# Patient Record
Sex: Male | Born: 2005
Health system: Southern US, Community
[De-identification: ages and names within clinical notes are randomized; demographics above are authoritative.]

## PROBLEM LIST (undated history)

## (undated) ENCOUNTER — Emergency Department (HOSPITAL_COMMUNITY): Admission: EM | Payer: Self-pay | Source: Home / Self Care

## (undated) DIAGNOSIS — L509 Urticaria, unspecified: Secondary | ICD-10-CM

## (undated) DIAGNOSIS — L309 Dermatitis, unspecified: Secondary | ICD-10-CM

## (undated) HISTORY — PX: TYMPANOSTOMY TUBE PLACEMENT: SHX32

## (undated) HISTORY — DX: Urticaria, unspecified: L50.9

## (undated) HISTORY — DX: Dermatitis, unspecified: L30.9

---
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
## 2024-02-24 ENCOUNTER — Ambulatory Visit: Payer: 59 | Admitting: Allergy

## 2024-02-24 DIAGNOSIS — J309 Allergic rhinitis, unspecified: Secondary | ICD-10-CM

## 2024-06-21 DIAGNOSIS — Z113 Encounter for screening for infections with a predominantly sexual mode of transmission: Secondary | ICD-10-CM | POA: Diagnosis not present

## 2024-06-21 DIAGNOSIS — F1220 Cannabis dependence, uncomplicated: Secondary | ICD-10-CM | POA: Diagnosis not present

## 2024-09-14 ENCOUNTER — Emergency Department (HOSPITAL_BASED_OUTPATIENT_CLINIC_OR_DEPARTMENT_OTHER)
Admission: EM | Admit: 2024-09-14 | Discharge: 2024-09-15 | Disposition: A | Discharge status: Home / Self Care | Attending: Emergency Medicine | Admitting: Emergency Medicine

## 2024-09-14 ENCOUNTER — Other Ambulatory Visit: Payer: Self-pay

## 2024-09-14 ENCOUNTER — Emergency Department (HOSPITAL_BASED_OUTPATIENT_CLINIC_OR_DEPARTMENT_OTHER): Admitting: Radiology

## 2024-09-14 DIAGNOSIS — L509 Urticaria, unspecified: Secondary | ICD-10-CM | POA: Insufficient documentation

## 2024-09-14 DIAGNOSIS — R9431 Abnormal electrocardiogram [ECG] [EKG]: Secondary | ICD-10-CM | POA: Diagnosis not present

## 2024-09-14 DIAGNOSIS — R0789 Other chest pain: Secondary | ICD-10-CM | POA: Diagnosis not present

## 2024-09-14 DIAGNOSIS — Z91010 Allergy to peanuts: Secondary | ICD-10-CM | POA: Insufficient documentation

## 2024-09-14 DIAGNOSIS — R079 Chest pain, unspecified: Secondary | ICD-10-CM | POA: Diagnosis not present

## 2024-09-14 DIAGNOSIS — T7840XA Allergy, unspecified, initial encounter: Secondary | ICD-10-CM | POA: Diagnosis not present

## 2024-09-14 DIAGNOSIS — R21 Rash and other nonspecific skin eruption: Secondary | ICD-10-CM | POA: Diagnosis present

## 2024-09-14 LAB — CBC
HCT: 49.5 % (ref 39.0–52.0)
Hemoglobin: 16.7 g/dL (ref 13.0–17.0)
MCH: 32.1 pg (ref 26.0–34.0)
MCHC: 33.7 g/dL (ref 30.0–36.0)
MCV: 95 fL (ref 80.0–100.0)
Platelets: 388 K/uL (ref 150–400)
RBC: 5.21 MIL/uL (ref 4.22–5.81)
RDW: 11.1 % — ABNORMAL LOW (ref 11.5–15.5)
WBC: 8 K/uL (ref 4.0–10.5)
nRBC: 0 % (ref 0.0–0.2)

## 2024-09-14 MED ORDER — ONDANSETRON HCL 4 MG/2ML IJ SOLN
4.0000 mg | Freq: Once | INTRAMUSCULAR | Status: AC
  Administered 2024-09-14: 4 mg via INTRAVENOUS
  Filled 2024-09-14: qty 2

## 2024-09-14 NOTE — ED Triage Notes (Signed)
 Pt POV reporting increased hives x2 days, has been dealing with intermittent allergic reactions past few months, unaware of cause, worsening hives 2 days ago, steroid shot given today at PCP, now having chest pain and SOB, denies throat swelling at this time.

## 2024-09-15 LAB — BASIC METABOLIC PANEL WITH GFR
Anion gap: 16 — ABNORMAL HIGH (ref 5–15)
BUN: 9 mg/dL (ref 6–20)
CO2: 24 mmol/L (ref 22–32)
Calcium: 10.1 mg/dL (ref 8.9–10.3)
Chloride: 99 mmol/L (ref 98–111)
Creatinine, Ser: 1.21 mg/dL (ref 0.61–1.24)
GFR, Estimated: >60 mL/min (ref >60)
Glucose, Bld: 103 mg/dL — ABNORMAL HIGH (ref 70–99)
Potassium: 3.7 mmol/L (ref 3.5–5.1)
Sodium: 139 mmol/L (ref 135–145)

## 2024-09-15 LAB — TROPONIN T, HIGH SENSITIVITY: Troponin T High Sensitivity: <15 ng/L (ref 0–19)

## 2024-09-15 MED ORDER — METHYLPREDNISOLONE SODIUM SUCC 125 MG IJ SOLR
125.0000 mg | Freq: Once | INTRAMUSCULAR | Status: AC
Start: 2024-09-15 — End: 2024-09-15
  Administered 2024-09-15: 125 mg via INTRAVENOUS
  Filled 2024-09-15: qty 2

## 2024-09-15 MED ORDER — FAMOTIDINE IN NACL 20-0.9 MG/50ML-% IV SOLN
20.0000 mg | Freq: Once | INTRAVENOUS | Status: AC
  Administered 2024-09-15: 20 mg via INTRAVENOUS
  Filled 2024-09-15: qty 50

## 2024-09-15 MED ORDER — DIPHENHYDRAMINE HCL 50 MG/ML IJ SOLN
25.0000 mg | Freq: Once | INTRAMUSCULAR | Status: AC
  Administered 2024-09-15: 25 mg via INTRAVENOUS
  Filled 2024-09-15: qty 1

## 2024-09-15 MED ORDER — PREDNISONE 10 MG PO TABS: 20.0000 mg | ORAL_TABLET | Freq: Two times a day (BID) | ORAL | 0 refills | Status: DC

## 2024-09-15 NOTE — ED Provider Notes (Signed)
 [place] EMERGENCY DEPARTMENT AT [facility] Provider Note   CSN: [id number] Arrival date & time: 09/14/24  2247     Patient presents with: Urticaria   Gerald Peterson is a 18 y.o. male.   Patient is a 18 year old male with history of unexplained urticaria.  He has seen an allergist for this condition, but no definitive cause has been found.  He was told he was allergic to peanuts, but this does not seem to be a trigger for him.  Patient presenting today with complaints of rash/hives and swelling to his face and extremities.  He went to his primary doctor who was given a steroid injection.  He went home, then developed some tightness in his chest and feeling short of breath.  This has since resolved.  He denies any fevers or chills.  No new contacts or exposures.       Prior to Admission medications   Medication Sig Start Date End Date Taking? Authorizing Provider  CIMETIDINE PO Take by mouth.    [provider]  famotidine  (PEPCID ) 20 MG tablet Take 1 tablet (20 mg total) by mouth 2 (two) times daily. 08/26/23   Jeneal Danita Macintosh, MD  loratadine  (CLARITIN ) 10 MG tablet Take 1 tablet (10 mg total) by mouth 2 (two) times daily. 08/26/23   Jeneal Danita Macintosh, MD  Multiple Vitamins-Minerals (MENS MULTIVITAMIN PO) Take by mouth. Patient not taking: Reported on 08/26/2023    [provider]  omalizumab  (XOLAIR ) 150 MG/ML prefilled syringe INJECT 2 SYRINGES UNDER THE SKIN EVERY 4 WEEKS 08/11/23   Jeneal Danita Macintosh, MD    Allergies: Peanut-containing drug products    Review of Systems  All other systems reviewed and are negative.   Updated Vital Signs BP 134/69   Pulse 95   Temp 98.9 F (37.2 C)   Resp 18   Ht 5' 10 (1.778 m)   Wt 73.9 kg   SpO2 97%   BMI 23.39 kg/m   Physical Exam Vitals and nursing note reviewed.  Constitutional:      General: He is not in acute distress.    Appearance: He is well-developed. He is not  diaphoretic.  HENT:     Head: Normocephalic and atraumatic.  Cardiovascular:     Rate and Rhythm: Normal rate and regular rhythm.     Heart sounds: No murmur heard.    No friction rub.  Pulmonary:     Effort: Pulmonary effort is normal. No respiratory distress.     Breath sounds: Normal breath sounds. No wheezing or rales.  Abdominal:     General: Bowel sounds are normal. There is no distension.     Palpations: Abdomen is soft.     Tenderness: There is no abdominal tenderness.  Musculoskeletal:        General: Normal range of motion.     Cervical back: Normal range of motion and neck supple.  Skin:    General: Skin is warm and dry.     Comments: There are small urticarial lesions noted to the forehead and both upper extremities.  Neurological:     Mental Status: He is alert and oriented to person, place, and time.     Coordination: Coordination normal.     (all labs ordered are listed, but only abnormal results are displayed) Labs Reviewed  BASIC METABOLIC PANEL WITH GFR - Abnormal; Notable for the following components:      Result Value   Glucose, Bld 103 (*)  Anion gap 16 (*)    All other components within normal limits  CBC - Abnormal; Notable for the following components:   RDW 11.1 (*)    All other components within normal limits  TROPONIN T, HIGH SENSITIVITY  TROPONIN T, HIGH SENSITIVITY    EKG: None  Radiology: DG Chest 2 View Result Date: 09/14/2024 CLINICAL DATA:  Chest pain. Increasing hives over 2 days. Intermittent allergic reactions over the past few months. Chest pain and shortness of breath. EXAM: CHEST - 2 VIEW COMPARISON:  08/01/2022 FINDINGS: The heart size and mediastinal contours are within normal limits. Both lungs are clear. The visualized skeletal structures are unremarkable. IMPRESSION: No active cardiopulmonary disease. Electronically Signed   By: Elsie Gravely M.D.   On: 09/14/2024 23:21     Procedures   Medications Ordered in the ED   methylPREDNISolone  sodium succinate (SOLU-MEDROL ) 125 mg/2 mL injection 125 mg (has no administration in time range)  diphenhydrAMINE  (BENADRYL ) injection 25 mg (has no administration in time range)  famotidine  (PEPCID ) IVPB 20 mg premix (has no administration in time range)  ondansetron  (ZOFRAN ) injection 4 mg (4 mg Intravenous Given 09/14/24 2342)                                    Medical Decision Making Amount and/or Complexity of Data Reviewed Labs: ordered. Radiology: ordered.  Risk Prescription drug management.   Patient presenting here with complaints of rash as described in the HPI.  He has been evaluated by an allergist, but no cause has been found.  Patient arrives here with an urticarial rash, but no evidence for anaphylaxis or systemic symptoms.  Patient given Solu-Medrol  along with Benadryl  and Pepcid  and rash is improving.  He will be discharged with prednisone  and continued use of his home medications.  To return as needed for any problems.     Final diagnoses:  None    ED Discharge Orders     None          Geroldine Berg, MD 09/15/24 [phone]

## 2024-09-15 NOTE — Discharge Instructions (Signed)
 Begin taking prednisone  as prescribed.  Take Benadryl  25 mg every 6 hours while awake for the next few days.  Return to the ER if you develop throat swelling, difficulty breathing, or for other new and concerning symptoms.

## 2024-09-17 ENCOUNTER — Emergency Department (HOSPITAL_BASED_OUTPATIENT_CLINIC_OR_DEPARTMENT_OTHER)
Admission: EM | Admit: 2024-09-17 | Discharge: 2024-09-17 | Disposition: A | Discharge status: Home / Self Care | Attending: Emergency Medicine | Admitting: Emergency Medicine

## 2024-09-17 ENCOUNTER — Other Ambulatory Visit: Payer: Self-pay

## 2024-09-17 ENCOUNTER — Encounter (HOSPITAL_BASED_OUTPATIENT_CLINIC_OR_DEPARTMENT_OTHER): Payer: Self-pay | Admitting: Emergency Medicine

## 2024-09-17 DIAGNOSIS — B349 Viral infection, unspecified: Secondary | ICD-10-CM | POA: Insufficient documentation

## 2024-09-17 DIAGNOSIS — L509 Urticaria, unspecified: Secondary | ICD-10-CM | POA: Insufficient documentation

## 2024-09-17 DIAGNOSIS — Z91010 Allergy to peanuts: Secondary | ICD-10-CM | POA: Insufficient documentation

## 2024-09-17 DIAGNOSIS — R509 Fever, unspecified: Secondary | ICD-10-CM | POA: Diagnosis present

## 2024-09-17 LAB — URINALYSIS, W/ REFLEX TO CULTURE (INFECTION SUSPECTED)
Bacteria, UA: NONE SEEN
Glucose, UA: NEGATIVE mg/dL
Hgb urine dipstick: NEGATIVE
Ketones, ur: 15 mg/dL — AB
Leukocytes,Ua: NEGATIVE
Nitrite: NEGATIVE
Protein, ur: 30 mg/dL — AB
Specific Gravity, Urine: 1.041 — ABNORMAL HIGH (ref 1.005–1.030)
pH: 6 (ref 5.0–8.0)

## 2024-09-17 LAB — CBC WITH DIFFERENTIAL/PLATELET
Abs Immature Granulocytes: 0.03 K/uL (ref 0.00–0.07)
Basophils Absolute: 0 K/uL (ref 0.0–0.1)
Basophils Relative: 0 %
Eosinophils Absolute: 0 K/uL (ref 0.0–0.5)
Eosinophils Relative: 0 %
HCT: 45.5 % (ref 39.0–52.0)
Hemoglobin: 15.8 g/dL (ref 13.0–17.0)
Immature Granulocytes: 0 %
Lymphocytes Relative: 10 %
Lymphs Abs: 1.2 K/uL (ref 0.7–4.0)
MCH: 32.4 pg (ref 26.0–34.0)
MCHC: 34.7 g/dL (ref 30.0–36.0)
MCV: 93.2 fL (ref 80.0–100.0)
Monocytes Absolute: 0.2 K/uL (ref 0.1–1.0)
Monocytes Relative: 2 %
Neutro Abs: 10.1 K/uL — ABNORMAL HIGH (ref 1.7–7.7)
Neutrophils Relative %: 88 %
Platelets: 393 K/uL (ref 150–400)
RBC: 4.88 MIL/uL (ref 4.22–5.81)
RDW: 11.1 % — ABNORMAL LOW (ref 11.5–15.5)
WBC: 11.6 K/uL — ABNORMAL HIGH (ref 4.0–10.5)
nRBC: 0 % (ref 0.0–0.2)

## 2024-09-17 LAB — COMPREHENSIVE METABOLIC PANEL WITH GFR
ALT: 8 U/L (ref 0–44)
AST: 20 U/L (ref 15–41)
Albumin: 4.6 g/dL (ref 3.5–5.0)
Alkaline Phosphatase: 73 U/L (ref 38–126)
Anion gap: 15 (ref 5–15)
BUN: 10 mg/dL (ref 6–20)
CO2: 27 mmol/L (ref 22–32)
Calcium: 9.8 mg/dL (ref 8.9–10.3)
Chloride: 94 mmol/L — ABNORMAL LOW (ref 98–111)
Creatinine, Ser: 1.11 mg/dL (ref 0.61–1.24)
GFR, Estimated: >60 mL/min (ref >60)
Glucose, Bld: 139 mg/dL — ABNORMAL HIGH (ref 70–99)
Potassium: 3.9 mmol/L (ref 3.5–5.1)
Sodium: 135 mmol/L (ref 135–145)
Total Bilirubin: 1.4 mg/dL — ABNORMAL HIGH (ref 0.0–1.2)
Total Protein: 7.7 g/dL (ref 6.5–8.1)

## 2024-09-17 LAB — RESP PANEL BY RT-PCR (RSV, FLU A&B, COVID)  RVPGX2
Influenza A by PCR: NEGATIVE
Influenza B by PCR: NEGATIVE
Resp Syncytial Virus by PCR: NEGATIVE
SARS Coronavirus 2 by RT PCR: NEGATIVE

## 2024-09-17 LAB — MONONUCLEOSIS SCREEN: Mono Screen: NEGATIVE

## 2024-09-17 LAB — LACTIC ACID, PLASMA: Lactic Acid, Venous: 1.8 mmol/L (ref 0.5–1.9)

## 2024-09-17 LAB — LIPASE, BLOOD: Lipase: 37 U/L (ref 11–51)

## 2024-09-17 MED ORDER — ONDANSETRON HCL 4 MG/2ML IJ SOLN
4.0000 mg | Freq: Once | INTRAMUSCULAR | Status: AC
  Administered 2024-09-17: 4 mg via INTRAVENOUS
  Filled 2024-09-17: qty 2

## 2024-09-17 MED ORDER — KETOROLAC TROMETHAMINE 30 MG/ML IJ SOLN
30.0000 mg | Freq: Once | INTRAMUSCULAR | Status: AC
  Administered 2024-09-17: 30 mg via INTRAVENOUS
  Filled 2024-09-17: qty 1

## 2024-09-17 MED ORDER — DIPHENHYDRAMINE HCL 50 MG/ML IJ SOLN
25.0000 mg | Freq: Once | INTRAMUSCULAR | Status: AC
  Administered 2024-09-17: 25 mg via INTRAVENOUS
  Filled 2024-09-17: qty 1

## 2024-09-17 MED ORDER — FAMOTIDINE IN NACL 20-0.9 MG/50ML-% IV SOLN
20.0000 mg | Freq: Once | INTRAVENOUS | Status: AC
  Administered 2024-09-17: 20 mg via INTRAVENOUS
  Filled 2024-09-17: qty 50

## 2024-09-17 MED ORDER — ONDANSETRON 4 MG PO TBDP: 4.0000 mg | ORAL_TABLET | Freq: Three times a day (TID) | ORAL | 0 refills | Status: AC | PRN

## 2024-09-17 NOTE — ED Notes (Signed)
 DC paperwork given and verbally understood.

## 2024-09-17 NOTE — ED Notes (Signed)
 Pt aware of the need for a urine... Pt currently unable to provide a sample.SABRASABRASABRA

## 2024-09-17 NOTE — ED Triage Notes (Signed)
 Pt via POV c/o continued swelling/hives and nausea since being treated here with steroids for an allergic reaction on 9/25. Unsure of initial allergen. Pt says his hands and feet are swollen. Mom notes that pt has had multiple episodes of hives since receiving the 2nd covid vaccine a few years ago. Multiple visits to allergist w/o answers. T101.3 in triage and visible hives generalized.

## 2024-09-17 NOTE — ED Provider Notes (Signed)
 [place] EMERGENCY DEPARTMENT AT [facility] Provider Note   CSN: [id number] Arrival date & time: 09/17/24  1431     Patient presents with: Nausea and swelling   Gerald Peterson is a 18 y.o. male w/  hx of idiopathic urticaria, angioedema, presenting to the ED with complaint of generalized body pain, nausea, vomiting, joint pain in his hands and feet, mild headache, mild sore throat.  Patient was seen 2 days in the ED at that time for chest pain and hives and nausea.  He was treated with steroids in the ED, and reports it did not help his hives at all.  He says in the past steroids have never helped him.  Often times antihistamines will help.  He says he is back today because of a fever and worsening symptoms.  Specifically feels like his hands and feet are swelling.  He is here with his mother as well.  She reports that he has seen an allergy specialist in the past.  I did review this office note, and the allergy notes that the patient has urticaria and this can often be triggered by illnesses and fevers.  The patient gets Xolaire for this.  There is no known family history of autoimmune diseases according to his mother.   HPI     Prior to Admission medications   Medication Sig Start Date End Date Taking? Authorizing Provider  ondansetron  (ZOFRAN -ODT) 4 MG disintegrating tablet Take 1 tablet (4 mg total) by mouth every 8 (eight) hours as needed for up to 12 doses for nausea or vomiting. 09/17/24  Yes Sarinah Doetsch, Donnice PARAS, MD  CIMETIDINE PO Take by mouth.    [provider]  famotidine  (PEPCID ) 20 MG tablet Take 1 tablet (20 mg total) by mouth 2 (two) times daily. 08/26/23   Jeneal Danita Macintosh, MD  loratadine  (CLARITIN ) 10 MG tablet Take 1 tablet (10 mg total) by mouth 2 (two) times daily. 08/26/23   Jeneal Danita Macintosh, MD  Multiple Vitamins-Minerals (MENS MULTIVITAMIN PO) Take by mouth. Patient not taking: Reported on 08/26/2023    [provider]   omalizumab  (XOLAIR ) 150 MG/ML prefilled syringe INJECT 2 SYRINGES UNDER THE SKIN EVERY 4 WEEKS 08/11/23   Padgett, Danita Macintosh, MD  predniSONE  (DELTASONE ) 10 MG tablet Take 2 tablets (20 mg total) by mouth 2 (two) times daily with a meal. 09/15/24   Geroldine Berg, MD    Allergies: Peanut-containing drug products    Review of Systems  Updated Vital Signs BP 109/60 (BP Location: Right Arm)   Pulse 97   Temp 98.5 F (36.9 C) (Oral)   Resp 16   Ht 5' 10 (1.778 m)   Wt 69.4 kg   SpO2 94%   BMI 21.95 kg/m   Physical Exam Constitutional:      General: He is not in acute distress. HENT:     Head: Normocephalic and atraumatic.     Mouth/Throat:     Mouth: Mucous membranes are dry.  Eyes:     Conjunctiva/sclera: Conjunctivae normal.     Pupils: Pupils are equal, round, and reactive to light.  Cardiovascular:     Rate and Rhythm: Normal rate and regular rhythm.  Pulmonary:     Effort: Pulmonary effort is normal. No respiratory distress.  Abdominal:     General: There is no distension.     Tenderness: There is no abdominal tenderness.  Skin:    General: Skin is warm and dry.  Neurological:  General: No focal deficit present.     Mental Status: He is alert. Mental status is at baseline.  Psychiatric:        Mood and Affect: Mood normal.        Behavior: Behavior normal.     (all labs ordered are listed, but only abnormal results are displayed) Labs Reviewed  COMPREHENSIVE METABOLIC PANEL WITH GFR - Abnormal; Notable for the following components:      Result Value   Chloride 94 (*)    Glucose, Bld 139 (*)    Total Bilirubin 1.4 (*)    All other components within normal limits  CBC WITH DIFFERENTIAL/PLATELET - Abnormal; Notable for the following components:   WBC 11.6 (*)    RDW 11.1 (*)    Neutro Abs 10.1 (*)    All other components within normal limits  RESP PANEL BY RT-PCR (RSV, FLU A&B, COVID)  RVPGX2  LACTIC ACID, PLASMA  MONONUCLEOSIS SCREEN  LIPASE,  BLOOD  URINALYSIS, W/ REFLEX TO CULTURE (INFECTION SUSPECTED)    EKG: None  Radiology: No results found.   Procedures   Medications Ordered in the ED  ondansetron  (ZOFRAN ) injection 4 mg (4 mg Intravenous Given 09/17/24 1544)  famotidine  (PEPCID ) IVPB 20 mg premix (0 mg Intravenous Stopped 09/17/24 1644)  diphenhydrAMINE  (BENADRYL ) injection 25 mg (25 mg Intravenous Given 09/17/24 1547)  ketorolac (TORADOL) 30 MG/ML injection 30 mg (30 mg Intravenous Given 09/17/24 1546)    Clinical Course as of 09/17/24 1716  Sun Sep 17, 2024  1608 Feeling much better, hives improved, will po challenge [MT]    Clinical Course User Index [MT] Naiara Lombardozzi, Donnice PARAS, MD                                 Medical Decision Making Amount and/or Complexity of Data Reviewed Labs: ordered.  Risk Prescription drug management.   This patient presents to the ED with concern for generalized fatigue, fever, joint pain. This involves an extensive number of treatment options, and is a complaint that carries with it a high risk of complications and morbidity.  The differential diagnosis includes viral illness versus autoimmune disorder versus other  No evidence of airway involvement or anaphylaxis on arrival.  No indication for EpiPen .  No evidence of angioedema.  Additional history obtained from the patient's mother  External records from outside source obtained and reviewed including allergen specialty note  I ordered and personally interpreted labs.  The pertinent results include:  no emergent findings  Patient had a chest x-ray performed 2 days ago which I reviewed which showed no focal or emergent findings.  He does not have any signs or symptoms of pneumonia and I did not think we need repeat imaging of his chest at this time.  Likewise his abdominal exam is quite benign, with no focal tenderness to raise concern at this time for acute appendicitis or biliary disease or other life-threatening condition.   Doubt strep pharyngitis clinically.  The patient was maintained on a cardiac monitor.  I personally viewed and interpreted the cardiac monitored which showed an underlying rhythm of: Sinus rhythm   I ordered medication including IV gastritis medications, IV Toradol for joint pain, IV Zofran  for nausea  I have reviewed the patients home medicines and have made adjustments as needed  After the interventions noted above, I reevaluated the patient and found that they have: improved   Disposition:  After consideration  of the diagnostic results and the patient's response to treatment, I feel that the patient would benefit from outpatient follow up      Final diagnoses:  Urticaria  Viral illness    ED Discharge Orders          Ordered    ondansetron  (ZOFRAN -ODT) 4 MG disintegrating tablet  Every 8 hours PRN        09/17/24 1715               Cottie Donnice PARAS, MD 09/17/24 [phone]

## 2024-09-28 DIAGNOSIS — H5213 Myopia, bilateral: Secondary | ICD-10-CM | POA: Diagnosis not present

## 2024-10-09 ENCOUNTER — Other Ambulatory Visit: Payer: Self-pay

## 2024-10-09 ENCOUNTER — Encounter (HOSPITAL_BASED_OUTPATIENT_CLINIC_OR_DEPARTMENT_OTHER): Payer: Self-pay | Admitting: Emergency Medicine

## 2024-10-09 ENCOUNTER — Emergency Department (HOSPITAL_BASED_OUTPATIENT_CLINIC_OR_DEPARTMENT_OTHER)
Admission: EM | Admit: 2024-10-09 | Discharge: 2024-10-09 | Disposition: A | Discharge status: Home / Self Care | Attending: Emergency Medicine | Admitting: Emergency Medicine

## 2024-10-09 DIAGNOSIS — L818 Other specified disorders of pigmentation: Secondary | ICD-10-CM | POA: Diagnosis not present

## 2024-10-09 DIAGNOSIS — X58XXXA Exposure to other specified factors, initial encounter: Secondary | ICD-10-CM | POA: Insufficient documentation

## 2024-10-09 DIAGNOSIS — S61401A Unspecified open wound of right hand, initial encounter: Secondary | ICD-10-CM | POA: Diagnosis not present

## 2024-10-09 DIAGNOSIS — L923 Foreign body granuloma of the skin and subcutaneous tissue: Secondary | ICD-10-CM

## 2024-10-09 DIAGNOSIS — Z91010 Allergy to peanuts: Secondary | ICD-10-CM | POA: Insufficient documentation

## 2024-10-09 MED ORDER — CEPHALEXIN 250 MG PO CAPS
500.0000 mg | ORAL_CAPSULE | Freq: Once | ORAL | Status: AC
  Administered 2024-10-09: 500 mg via ORAL
  Filled 2024-10-09: qty 2

## 2024-10-09 MED ORDER — CEPHALEXIN 500 MG PO CAPS: 500.0000 mg | ORAL_CAPSULE | Freq: Four times a day (QID) | ORAL | 0 refills | Status: DC

## 2024-10-09 NOTE — Discharge Instructions (Signed)
 Take the antibiotic as prescribed which will cover if any secondary infection is present. There is the possibility that your are simply reacting to the red ink, in which case this should resolve without difficulty.   If symptoms worsen, see your doctor or return to the ED for recheck.

## 2024-10-09 NOTE — ED Triage Notes (Signed)
 Reports blisters to R hand after home tattoo on Friday. Denies fevers.

## 2024-10-09 NOTE — ED Provider Notes (Signed)
 [place] EMERGENCY DEPARTMENT AT [facility] Provider Note   CSN: [id number] Arrival date & time: 10/09/24  1301     Patient presents with: Gerald Peterson Infection   Gerald Peterson is a 18 y.o. male.   Patient to ED 3 days after getting a tattoo to the right hand. He states the tattoo artist came to his house to do the work and endorses he used disposable needles and clean equipment. Since yesterday he notes there is an area on the dorsum of the hand that blistered and later opened. Minimal pain. No fever. He states the only area that blistered were where red ink was placed. The remainder of the tattoo was done in black ink and is intact and healing well. No fever. No significant swelling. No drainage.   The history is provided by the patient. No language interpreter was used.       Prior to Admission medications   Medication Sig Start Date End Date Taking? Authorizing Provider  cephALEXin (KEFLEX) 500 MG capsule Take 1 capsule (500 mg total) by mouth 4 (four) times daily. 10/09/24  Yes Daina Cara, Margit, PA-C  CIMETIDINE PO Take by mouth.    [provider]  famotidine  (PEPCID ) 20 MG tablet Take 1 tablet (20 mg total) by mouth 2 (two) times daily. 08/26/23   Jeneal Danita Macintosh, MD  loratadine  (CLARITIN ) 10 MG tablet Take 1 tablet (10 mg total) by mouth 2 (two) times daily. 08/26/23   Jeneal Danita Macintosh, MD  Multiple Vitamins-Minerals (MENS MULTIVITAMIN PO) Take by mouth. Patient not taking: Reported on 08/26/2023    [provider]  omalizumab  (XOLAIR ) 150 MG/ML prefilled syringe INJECT 2 SYRINGES UNDER THE SKIN EVERY 4 WEEKS 08/11/23   Padgett, Danita Macintosh, MD  ondansetron  (ZOFRAN -ODT) 4 MG disintegrating tablet Take 1 tablet (4 mg total) by mouth every 8 (eight) hours as needed for up to 12 doses for nausea or vomiting. 09/17/24   Cottie Donnice PARAS, MD  predniSONE  (DELTASONE ) 10 MG tablet Take 2 tablets (20 mg total) by mouth 2 (two) times daily with a  meal. 09/15/24   Geroldine Berg, MD    Allergies: Peanut-containing drug products    Review of Systems  Updated Vital Signs BP 126/67 (BP Location: Right Arm)   Pulse 63   Temp 98.7 F (37.1 C) (Oral)   Resp 18   SpO2 100%   Physical Exam Vitals and nursing note reviewed.  Constitutional:      Appearance: He is well-developed.  Pulmonary:     Effort: Pulmonary effort is normal.  Musculoskeletal:        General: Normal range of motion.     Cervical back: Normal range of motion.  Skin:    General: Skin is warm and dry.     Comments: Open superficial wound to dorsal right hand with partial scabbing. No drainage. No surrounding swelling or redness.   Neurological:     Mental Status: He is alert and oriented to person, place, and time.        (all labs ordered are listed, but only abnormal results are displayed) Labs Reviewed - No data to display  EKG: None  Radiology: No results found.   Procedures   Medications Ordered in the ED  cephALEXin (KEFLEX) capsule 500 mg (500 mg Oral Given 10/09/24 1630)    Clinical Course as of 10/09/24 1631  Mon Oct 09, 2024  1628 Patient with new tattoo x 3 days, with open wound that erupted over part  of the artwork. Only the part where red ink was placed is affected raising concern for allergic rxn vs infection. Will cover with Keflex, give care instructions and follow up care instructions.   Discussed the risk of hepatitis C with home tattoo. Patient has no concerns. Tetanus is current.  [SU]    Clinical Course User Index [SU] Odell Balls, PA-C                                 Medical Decision Making       Final diagnoses:  Tattoo reaction    ED Discharge Orders          Ordered    cephALEXin (KEFLEX) 500 MG capsule  4 times daily        10/09/24 1620               Odell Balls, PA-C 10/09/24 1631    Cottie Donnice PARAS, MD 10/09/24 2340

## 2024-10-09 NOTE — ED Notes (Signed)
 Patient given discharge instructions. Questions were answered. Patient verbalized understanding of discharge instructions and care at home.

## 2025-01-04 ENCOUNTER — Encounter: Payer: Self-pay | Admitting: Allergy

## 2025-01-04 ENCOUNTER — Other Ambulatory Visit: Payer: Self-pay

## 2025-01-04 ENCOUNTER — Ambulatory Visit: Payer: Self-pay | Admitting: Allergy

## 2025-01-04 VITALS — BP 112/78 | HR 90 | Temp 98.7°F | Ht 69.0 in | Wt 172.4 lb

## 2025-01-04 DIAGNOSIS — L501 Idiopathic urticaria: Secondary | ICD-10-CM

## 2025-01-04 NOTE — Patient Instructions (Addendum)
 Hives and swelling - etiology of hives and swelling is spontaneous. However believe you have had hives flare related to immune system activation (like with illness) as well as temperature changes.  Changes in body temperature like having fever due to illness or prolonged sun exposure increase body temperature is also a known trigger of hives in some people.  Hives can be caused by a variety of different triggers including illness/infection, foods, medications, stings, exercise, pressure, vibrations, extremes of temperature to name a few however majority of the time there is no identifiable trigger.   - hive work-up showed environmental allergy testing positive to grass pollen, tree pollen, weed pollen. Continue with avoidance measures to pollen.   - I do not believe you have any food allergy thus do not recommend any food avoidances.   - You were on Xolair  monthly injections before and have been off over the past 1.5 years with continued intermittent hives.  You have had several ED visits for hives treatments.  - Discussed today Rhapsido oral regimen as new hive control therapy.  It works very quickly for hive control.  It is taken 1 tab twice a day.  Month sample supply provided.  Tammy, our Environmental education officer, will call you regarding insurance coverage and how to get medication after samples runs out.  Benefits, risk and mechanism of action discussed today.   - Use Claritin  and Pepcid  both 1-2 times a day for antihistamine blockade   Follow-up in 3-4 months or sooner if needed

## 2025-01-04 NOTE — Progress Notes (Signed)
 "   Follow-up Note  RE: Gerald Peterson MRN: [id number] DOB: [birth year] Date of Office Visit: 01/04/2025   History of present illness: Gerald Peterson is a 19 y.o. male presenting today for follow-up of urticaria.  He was last seen in the office on 08/26/23 by myself.  Discussed the use of AI scribe software for clinical note transcription with the patient, who gave verbal consent to proceed.  He has been experiencing intermittent hives since his last visit in September 2024, with the most recent severe breakout occurring two weeks ago. During the summer, he experienced minor breakouts, primarily on his face.  He was previously on Xolair , with the last dose administered in the fall of 2024. Since discontinuing Xolair , he is unsure if there has been an increase in hive frequency or not. He recalls not experiencing breakouts immediately after receiving Xolair  injections.  But now noticing flares of hives every couple months.  For hive management, he uses Claritin  and Pepcid  during flare-ups. He has not been taking any antihistamines regularly, only when symptoms arise.  In September, he experienced a severe flare-up that led to a visit to the emergency room, where he was administered steroids. However, the steroids did not alleviate his symptoms and he subsequently felt unwell due to steroids.      Review of systems: 10pt ROS negative unless noted above in HPI   Past medical/social/surgical/family history have been reviewed and are unchanged unless specifically indicated below.  No changes  Medication List: Current Outpatient Medications  Medication Sig Dispense Refill   famotidine  (PEPCID ) 20 MG tablet Take 1 tablet (20 mg total) by mouth 2 (two) times daily. 180 tablet 1   loratadine  (CLARITIN ) 10 MG tablet Take 10 mg by mouth daily.     Multiple Vitamins-Minerals (MENS MULTIVITAMIN PO) Take by mouth.     CIMETIDINE PO Take by mouth.     omalizumab  (XOLAIR ) 150 MG/ML prefilled syringe  INJECT 2 SYRINGES UNDER THE SKIN EVERY 4 WEEKS (Patient not taking: Reported on 01/04/2025) 4 mL 11   ondansetron  (ZOFRAN -ODT) 4 MG disintegrating tablet Take 1 tablet (4 mg total) by mouth every 8 (eight) hours as needed for up to 12 doses for nausea or vomiting. (Patient not taking: Reported on 01/04/2025) 12 tablet 0   predniSONE  (DELTASONE ) 10 MG tablet Take 2 tablets (20 mg total) by mouth 2 (two) times daily with a meal. (Patient not taking: Reported on 01/04/2025) 20 tablet 0   Current Facility-Administered Medications  Medication Dose Route Frequency Provider Last Rate Last Admin   omalizumab  (XOLAIR ) prefilled syringe 300 mg  300 mg Subcutaneous Q28 days Jeneal Danita Macintosh, MD   300 mg at 07/12/23 9049     Known medication allergies: Allergies[1]   Physical examination: Blood pressure 112/78, pulse 90, temperature 98.7 F (37.1 C), temperature source Temporal, height 5' 9 (1.753 m), weight 172 lb 6.4 oz (78.2 kg), SpO2 98%.  General: Alert, interactive, in no acute distress. HEENT: PERRLA, TMs pearly gray, turbinates non-edematous without discharge, post-pharynx non erythematous. Neck: Supple without lymphadenopathy. Lungs: Clear to auscultation without wheezing, rhonchi or rales. {no increased work of breathing. CV: Normal S1, S2 without murmurs. Abdomen: Nondistended, nontender. Skin: Warm and dry, without lesions or rashes. Extremities:  No clubbing, cyanosis or edema. Neuro:   Grossly intact.  Diagnostics/Labs: None today  Assessment and plan: Hives and swelling - etiology of hives and swelling is spontaneous. However believe you have had hives flare related to immune system activation (like  with illness) as well as temperature changes.  Changes in body temperature like having fever due to illness or prolonged sun exposure increase body temperature is also a known trigger of hives in some people.  Hives can be caused by a variety of different triggers including  illness/infection, foods, medications, stings, exercise, pressure, vibrations, extremes of temperature to name a few however majority of the time there is no identifiable trigger.   - hive work-up showed environmental allergy testing positive to grass pollen, tree pollen, weed pollen. Continue with avoidance measures to pollen.   - I do not believe you have any food allergy thus do not recommend any food avoidances.   - You were on Xolair  monthly injections before and have been off over the past 1.5 years with continued intermittent hives.  You have had several ED visits for hives treatments.  - Discussed today Rhapsido oral regimen as new hive control therapy.  It works very quickly for hive control.  It is taken 1 tab twice a day.  Month sample supply provided.  Tammy, our Environmental education officer, will call you regarding insurance coverage and how to get medication after samples runs out.  Benefits, risk and mechanism of action discussed today.   - Use Claritin  and Pepcid  both 1-2 times a day for antihistamine blockade   Follow-up in 3-4 months or sooner if needed  I appreciate the opportunity to take part in [place]'s care. Please do not hesitate to contact me with questions.  Sincerely,   Danita Brain, MD Allergy/Immunology [facility]      [1]  Allergies Allergen Reactions   Peanut-Containing Drug Products    "

## 2025-01-08 ENCOUNTER — Telehealth: Payer: Self-pay | Admitting: *Deleted

## 2025-01-08 NOTE — Telephone Encounter (Signed)
 L/m for patient to contact me to advise approval, copay card and submit to [employer] for Rhapsido

## 2025-01-08 NOTE — Telephone Encounter (Signed)
-----   Message from Danita Brain, MD sent at 01/04/2025 11:59 AM EST ----- Please do approval for Rhapsido.  Sample provided.  Was on Xolair  previously.

## 2025-01-12 NOTE — Telephone Encounter (Signed)
 L/m  for patient contact me again

## 2025-04-06 ENCOUNTER — Ambulatory Visit: Admitting: Allergy
